# Patient Record
Sex: Female | Born: 1935 | Race: White | Hispanic: No | Smoking: Never smoker
Health system: Southern US, Community
[De-identification: ages and names within clinical notes are randomized; demographics above are authoritative.]

## PROBLEM LIST (undated history)

## (undated) DIAGNOSIS — I10 Essential (primary) hypertension: Secondary | ICD-10-CM

## (undated) DIAGNOSIS — F0390 Unspecified dementia without behavioral disturbance: Secondary | ICD-10-CM

## (undated) DIAGNOSIS — E785 Hyperlipidemia, unspecified: Secondary | ICD-10-CM

---

## 2015-07-01 ENCOUNTER — Other Ambulatory Visit
Admission: RE | Admit: 2015-07-01 | Discharge: 2015-07-01 | Disposition: A | Discharge status: Home / Self Care | Payer: Medicare Other | Source: Ambulatory Visit | Attending: Nurse Practitioner | Admitting: Nurse Practitioner

## 2015-07-01 DIAGNOSIS — D649 Anemia, unspecified: Secondary | ICD-10-CM | POA: Insufficient documentation

## 2015-07-01 DIAGNOSIS — I10 Essential (primary) hypertension: Secondary | ICD-10-CM | POA: Insufficient documentation

## 2015-07-01 LAB — CBC
HCT: 36.9 % (ref 35.0–47.0)
HEMOGLOBIN: 12.3 g/dL (ref 12.0–16.0)
MCH: 31.2 pg (ref 26.0–34.0)
MCHC: 33.4 g/dL (ref 32.0–36.0)
MCV: 93.4 fL (ref 80.0–100.0)
Platelets: 234 10*3/uL (ref 150–440)
RBC: 3.95 MIL/uL (ref 3.80–5.20)
RDW: 13.9 % (ref 11.5–14.5)
WBC: 9.5 10*3/uL (ref 3.6–11.0)

## 2015-07-01 LAB — COMPREHENSIVE METABOLIC PANEL
ALK PHOS: 57 U/L (ref 38–126)
ALT: 15 U/L (ref 14–54)
ANION GAP: 3 — AB (ref 5–15)
AST: 19 U/L (ref 15–41)
Albumin: 3.4 g/dL — ABNORMAL LOW (ref 3.5–5.0)
BUN: 15 mg/dL (ref 6–20)
CALCIUM: 9.3 mg/dL (ref 8.9–10.3)
CO2: 29 mmol/L (ref 22–32)
Chloride: 99 mmol/L — ABNORMAL LOW (ref 101–111)
Creatinine, Ser: 0.55 mg/dL (ref 0.44–1.00)
GFR calc non Af Amer: >60 mL/min (ref >60)
Glucose, Bld: 71 mg/dL (ref 65–99)
POTASSIUM: 5 mmol/L (ref 3.5–5.1)
SODIUM: 131 mmol/L — AB (ref 135–145)
TOTAL PROTEIN: 6.5 g/dL (ref 6.5–8.1)
Total Bilirubin: 0.6 mg/dL (ref 0.3–1.2)

## 2015-07-01 LAB — OCCULT BLOOD X 1 CARD TO LAB, STOOL: Fecal Occult Bld: NEGATIVE

## 2015-07-06 ENCOUNTER — Other Ambulatory Visit
Admission: RE | Admit: 2015-07-06 | Discharge: 2015-07-06 | Disposition: A | Discharge status: Home / Self Care | Payer: Medicare Other | Source: Ambulatory Visit | Attending: Nurse Practitioner | Admitting: Nurse Practitioner

## 2015-07-06 DIAGNOSIS — N390 Urinary tract infection, site not specified: Secondary | ICD-10-CM | POA: Insufficient documentation

## 2015-07-06 LAB — URINALYSIS COMPLETE WITH MICROSCOPIC (ARMC ONLY)
BILIRUBIN URINE: NEGATIVE
Glucose, UA: NEGATIVE mg/dL
Hgb urine dipstick: NEGATIVE
KETONES UR: NEGATIVE mg/dL
NITRITE: NEGATIVE
Protein, ur: NEGATIVE mg/dL
Specific Gravity, Urine: 1.015 (ref 1.005–1.030)
Squamous Epithelial / LPF: NONE SEEN
pH: 8 (ref 5.0–8.0)

## 2015-07-09 LAB — URINE CULTURE

## 2015-07-15 ENCOUNTER — Other Ambulatory Visit
Admission: RE | Admit: 2015-07-15 | Discharge: 2015-07-15 | Disposition: A | Discharge status: Home / Self Care | Payer: Medicare Other | Source: Ambulatory Visit | Attending: Nurse Practitioner | Admitting: Nurse Practitioner

## 2015-07-15 DIAGNOSIS — N390 Urinary tract infection, site not specified: Secondary | ICD-10-CM | POA: Diagnosis present

## 2015-07-15 LAB — URINALYSIS COMPLETE WITH MICROSCOPIC (ARMC ONLY)
BILIRUBIN URINE: NEGATIVE
Glucose, UA: NEGATIVE mg/dL
Ketones, ur: NEGATIVE mg/dL
NITRITE: NEGATIVE
Protein, ur: NEGATIVE mg/dL
Specific Gravity, Urine: 1.015 (ref 1.005–1.030)
pH: 7 (ref 5.0–8.0)

## 2015-07-17 LAB — URINE CULTURE

## 2016-04-08 ENCOUNTER — Emergency Department: Payer: Medicare Other

## 2016-04-08 ENCOUNTER — Encounter: Payer: Self-pay | Admitting: Emergency Medicine

## 2016-04-08 ENCOUNTER — Emergency Department
Admission: EM | Admit: 2016-04-08 | Discharge: 2016-04-08 | Disposition: A | Discharge status: Home / Self Care | Payer: Medicare Other | Attending: Emergency Medicine | Admitting: Emergency Medicine

## 2016-04-08 DIAGNOSIS — S0181XA Laceration without foreign body of other part of head, initial encounter: Secondary | ICD-10-CM | POA: Diagnosis not present

## 2016-04-08 DIAGNOSIS — S0990XA Unspecified injury of head, initial encounter: Secondary | ICD-10-CM | POA: Diagnosis present

## 2016-04-08 DIAGNOSIS — Y92129 Unspecified place in nursing home as the place of occurrence of the external cause: Secondary | ICD-10-CM | POA: Diagnosis not present

## 2016-04-08 DIAGNOSIS — F0390 Unspecified dementia without behavioral disturbance: Secondary | ICD-10-CM | POA: Diagnosis not present

## 2016-04-08 DIAGNOSIS — Z79899 Other long term (current) drug therapy: Secondary | ICD-10-CM | POA: Insufficient documentation

## 2016-04-08 DIAGNOSIS — Y939 Activity, unspecified: Secondary | ICD-10-CM | POA: Insufficient documentation

## 2016-04-08 DIAGNOSIS — W19XXXA Unspecified fall, initial encounter: Secondary | ICD-10-CM | POA: Diagnosis not present

## 2016-04-08 DIAGNOSIS — I10 Essential (primary) hypertension: Secondary | ICD-10-CM | POA: Insufficient documentation

## 2016-04-08 DIAGNOSIS — Y999 Unspecified external cause status: Secondary | ICD-10-CM | POA: Insufficient documentation

## 2016-04-08 HISTORY — DX: Hyperlipidemia, unspecified: E78.5

## 2016-04-08 HISTORY — DX: Essential (primary) hypertension: I10

## 2016-04-08 HISTORY — DX: Unspecified dementia, unspecified severity, without behavioral disturbance, psychotic disturbance, mood disturbance, and anxiety: F03.90

## 2016-04-08 NOTE — ED Provider Notes (Signed)
[facility] Emergency Department Provider Note   ____________________________________________   First MD Initiated Contact with Patient 04/08/16 0216     (approximate)  I have reviewed the triage vital signs and the nursing notes.   HISTORY  Chief Complaint Fall ([facility]. [facility]  Patient with dementia and unable to participate in history  HPI Shelby Porter is a 82 y.o. female who comes from her nursing home this evening with a concern for a fall. The patient was found in bed with a laceration to her forehead. The patient was sleeping when she was found they were just doing checks on the patient's. The patient has no complaints at this time and does not want to be touched. The patient is unable to recall what occurred and she has dementia. She was sent in for evaluation for a fall   Past Medical History:  Diagnosis Date  . Dementia   . Hyperlipidemia   . Hypertension     There are no active problems to display for this patient.   History reviewed. No pertinent surgical history.  Prior to Admission medications   Medication Sig Start Date End Date Taking? Authorizing Provider  atorvastatin (LIPITOR) 10 MG tablet Take 10 mg by mouth daily.   Yes Historical Provider, MD    Allergies Patient has no known allergies.  No family history on file.  Social History Social History  Substance Use Topics  . Smoking status: Never Smoker  . Smokeless tobacco: Not on file  . Alcohol use No    Review of Systems  Unable to assess due to patient dementia ____________________________________________   PHYSICAL EXAM:  VITAL SIGNS: ED Triage Vitals  Enc Vitals Group     BP 04/08/16 0225 (!) 125/58     Pulse Rate 04/08/16 0225 63     Resp 04/08/16 0225 16     Temp 04/08/16 0225 97.5 F (36.4 C)     Temp Source 04/08/16 0225 Axillary     SpO2 04/08/16 0225 100 %     Weight 04/08/16 0226 150 lb (68 kg)   Height 04/08/16 0226 5\' 5"  (1.651 m)     Head Circumference --      Peak Flow --      Pain Score --      Pain Loc --      Pain Edu? --      Excl. in GC? --     Constitutional: Alert and Disoriented, yelling. no acute distress. Eyes: Conjunctivae are normal. PERRL. EOMI. Head: 2 cm vertical laceration to forehead with a skin tear over her left eyebrow Nose: No congestion/rhinnorhea. Mouth/Throat: Mucous membranes are moist.  Oropharynx non-erythematous. Neck: No cervical spine tenderness to palpation. Cardiovascular: Normal rate, regular rhythm. Grossly normal heart sounds.  Good peripheral circulation. Respiratory: Normal respiratory effort.  No retractions. Lungs CTAB. Gastrointestinal: Soft and nontender. No distention. Positive bowel sounds Musculoskeletal: No lower extremity tenderness nor edema.   Neurologic:  Normal speech and language.  Skin:  Skin is warm, dry and intact. Marland Kitchen. Psychiatric: Mood and affect are normal.   ____________________________________________   LABS (all labs ordered are listed, but only abnormal results are displayed)  Labs Reviewed - No data to display ____________________________________________  EKG  None  ____________________________________________  RADIOLOGY  CT head and cervical spine ____________________________________________   PROCEDURES  Procedure(s) performed: please, see procedure note(s).  Marland Kitchen..Laceration Repair Date/Time: 04/08/2016 2:30 AM Performed by: Rebecka ApleyWEBSTER, ALLISON P Authorized by:  WEBSTER, ALLISON P   Anesthesia (see MAR for exact dosages):    Anesthesia method:  None Laceration details:    Location:  Face   Face location:  Forehead   Length (cm):  2 Repair type:    Repair type:  Simple Exploration:    Hemostasis achieved with:  Direct pressure   Contaminated: no   Treatment:    Area cleansed with:  Saline   Amount of cleaning:  Standard   Irrigation solution:  Sterile saline Skin repair:    Repair  method:  Tissue adhesive Approximation:    Approximation:  Close   Vermilion border: well-aligned   Post-procedure details:    Dressing:  Open (no dressing)   Patient tolerance of procedure:  Tolerated well, no immediate complications    Critical Care performed: No  ____________________________________________   INITIAL IMPRESSION / ASSESSMENT AND PLAN / ED COURSE  Pertinent labs & imaging results that were available during my care of the patient were reviewed by me and considered in my medical decision making (see chart for details).  This is a 82 year old female who comes into the hospital today with a laceration to her forehead. The patient was not found on the floor but was already back in bed. We are unsure if the patient rolled over and hit her head on her night and if she fell out of bed. We will send the patient for a CT of her head and cervical spine. I did repair the patient's laceration. She'll be reevaluated.  Clinical Course as of Apr 08 340  Sun Apr 08, 2016  0322 1. Small right frontal scalp hematoma without fracture or acute intracranial abnormality. Atrophy and chronic small vessel ischemia. 2. Degenerative change throughout cervical spine without acute fracture or subluxation. 3. Incidental 5 mm right upper lobe pulmonary nodule. Prior exams to evaluate for imaging stability. Correlation with any prior exams recommended, as well as patient preference and comorbidities. In the absence of prior comparison, no follow-up needed if patient is low-risk. Non-contrast chest CT can be considered in 12 months if patient is high-risk.    CT Cervical Spine Wo Contrast [AW]    Clinical Course User Index [AW] Rebecka ApleyAllison P Webster, MD   The patient's CT scan does not show any skull fractures or intracranial hemorrhage. I did repair the patient's laceration. We also confirmed that the patient's baseline is to only know herself and to yell when she is touched. The patient will  be discharged home.  ____________________________________________   FINAL CLINICAL IMPRESSION(S) / ED DIAGNOSES  Final diagnoses:  Fall, initial encounter  Injury of head, initial encounter  Laceration of forehead, initial encounter      NEW MEDICATIONS STARTED DURING THIS VISIT:  New Prescriptions   No medications on file     Note:  This document was prepared using Dragon voice recognition software and may include unintentional dictation errors.    Rebecka ApleyAllison P Webster, MD 04/08/16 [phone]

## 2016-04-08 NOTE — ED Notes (Signed)
Pt sleeping, bed in low position, siderails up x2. resps unlabored.

## 2016-04-08 NOTE — ED Notes (Signed)
Patient transported to CT 

## 2016-04-08 NOTE — ED Triage Notes (Signed)
[facility]. [facility].  Pt. Found in bed sleeping when EMS arrived.  Pt. Has 1 laceration above rt. Eye and skin tear above lt. Eye.

## 2016-04-08 NOTE — ED Notes (Signed)
Pt with hematoma noted above right eyebrow, abrasion noted above left eyebrow approx 1cm. Pt with linear laceration approx 0.5cm noted to medial hematoma. No other injury noted. Pt arrived without medical history forms from [facility]. Pt is only able to confirm name and birthday, confused to other questions. Pt states "leave me alone, ow, get" when touched. Skin slightly pale, warm and dry.

## 2016-04-08 NOTE — Discharge Instructions (Signed)
Please follow up with your primary care physician.

## 2016-04-08 NOTE — ED Notes (Signed)
Report called to [employer]ie poteat, [facility]

## 2016-11-28 DEATH — deceased

## 2018-06-12 IMAGING — CT CT HEAD W/O CM
4 of 7 series · 14 of 47 positions shown, 15 images · non-contrast
Comparison: None.

CLINICAL DATA: Dementia patient post unwitnessed fall this evening
with head laceration.

EXAM:
CT HEAD WITHOUT CONTRAST
CT CERVICAL SPINE WITHOUT CONTRAST
TECHNIQUE: Multidetector CT imaging of the head and cervical spine was
performed following the standard protocol without intravenous
contrast. Multiplanar CT image reconstructions of the cervical spine
were also generated.

[Series 2: head wo · axial · 0.59mm/px · z∈[-154,-74]mm · 3 of 33 slices shown, 4 images]
[im 9/33  brain]
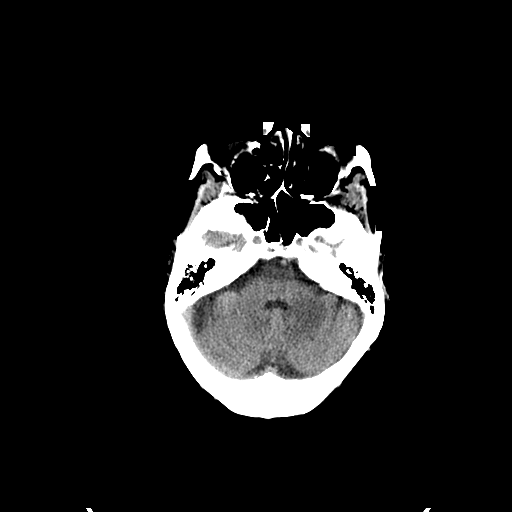
[im 9/33  bone]
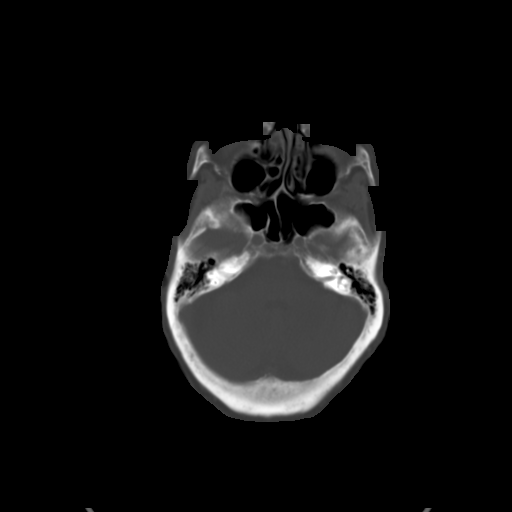
[im 17/33  brain]
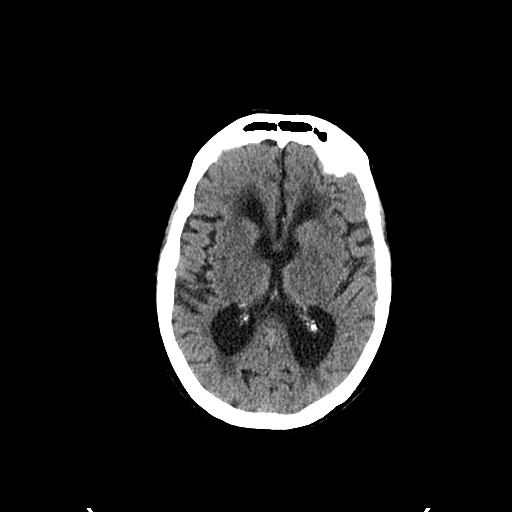
[im 25/33  brain]
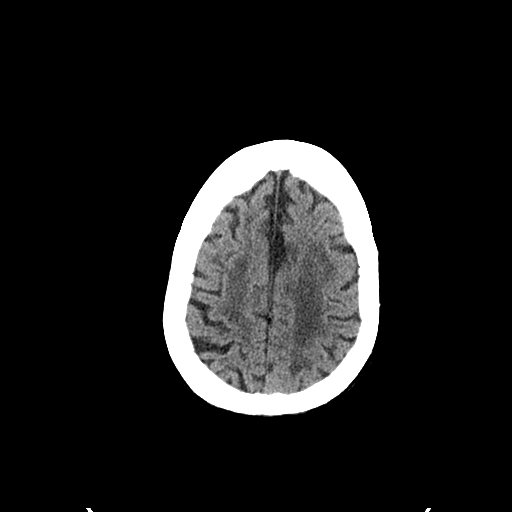

[Series 4: coronal soft tissue · coronal · 0.28mm/px · 3 of 62 slices shown]
[im 5/62  brain]
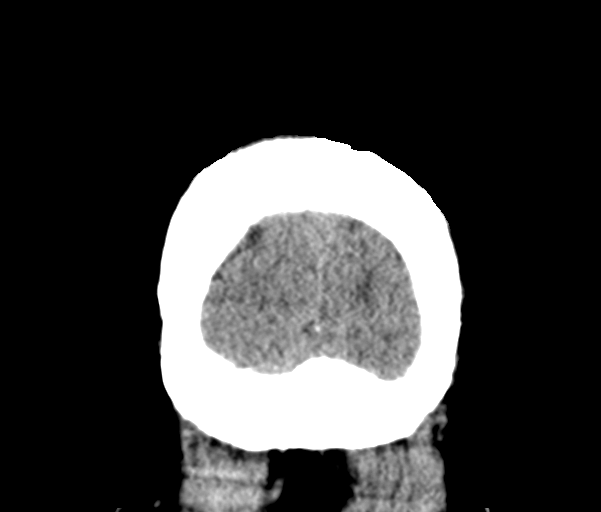
[im 9/62  brain]
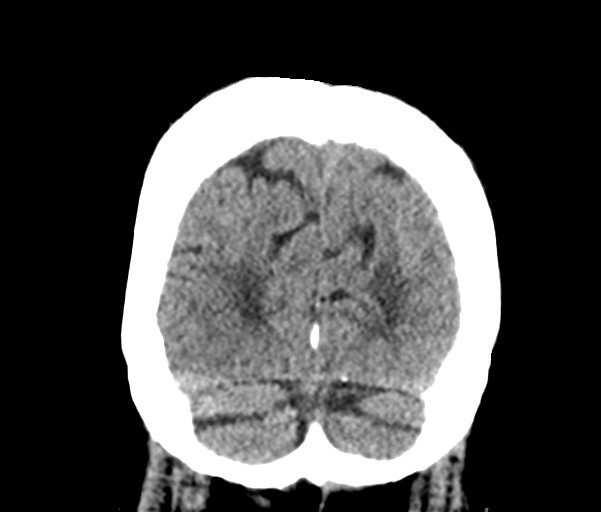
[im 14/62  brain]
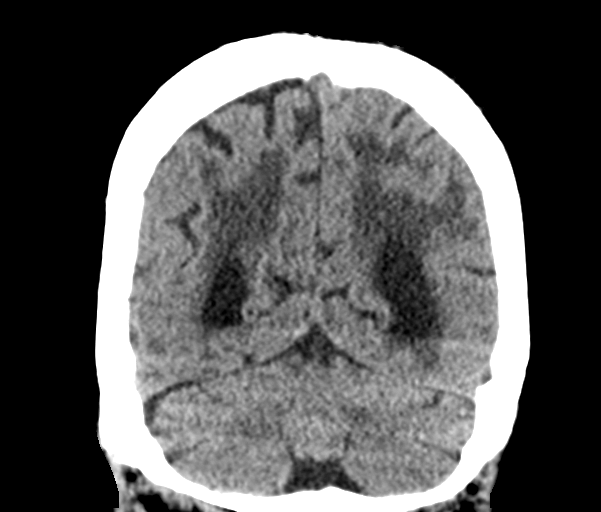

[Series 5: sagittal soft tissue · sagittal · 0.33mm/px · 2 of 46 slices shown]
[im 16/46  brain]
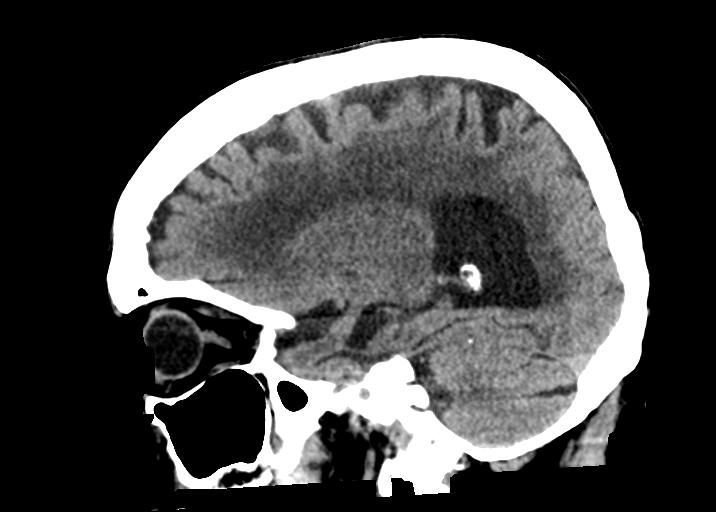
[im 31/46  brain]
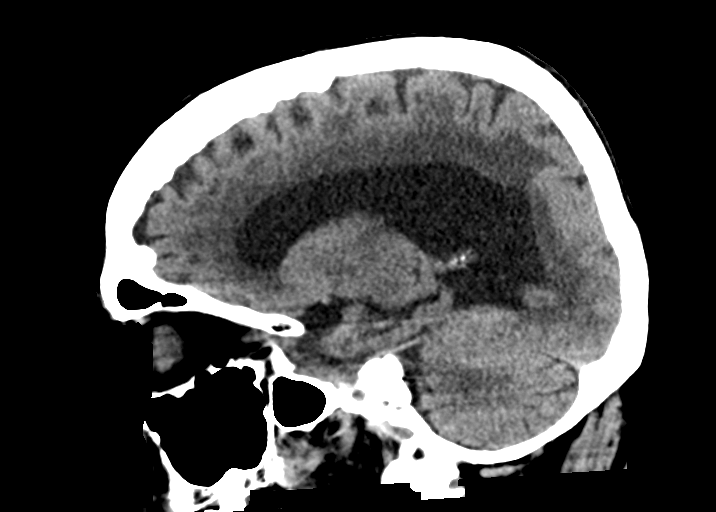

[Series 12: orthogonal bone · axial · 0.28mm/px · z∈[-336,-243]mm · 6 of 86 slices shown]
[im 7/86  bone]
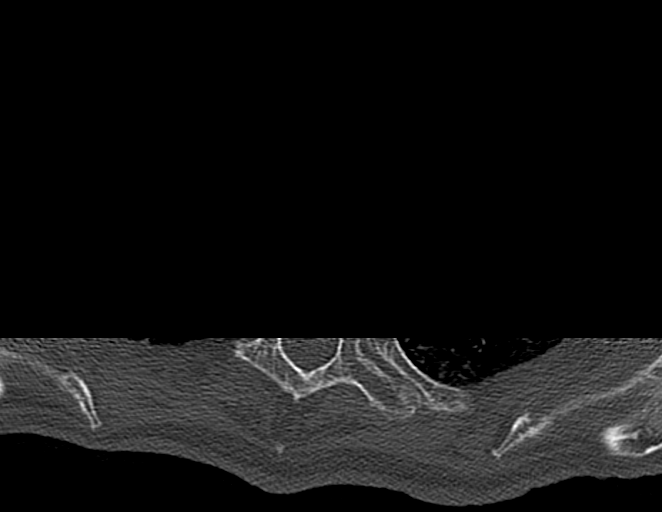
[im 20/86  bone]
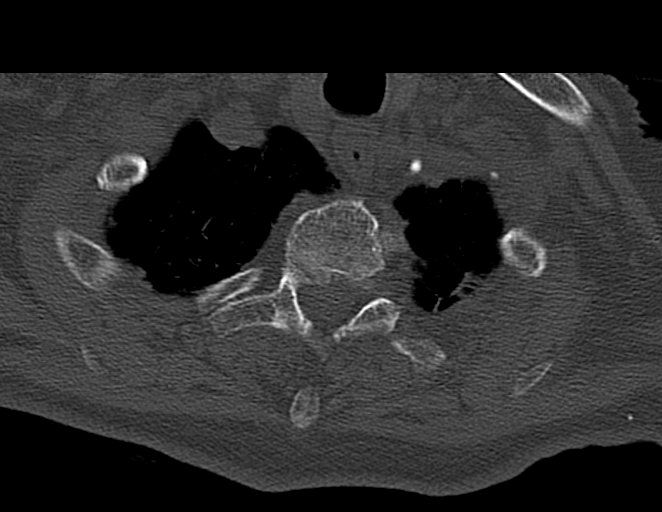
[im 27/86  bone]
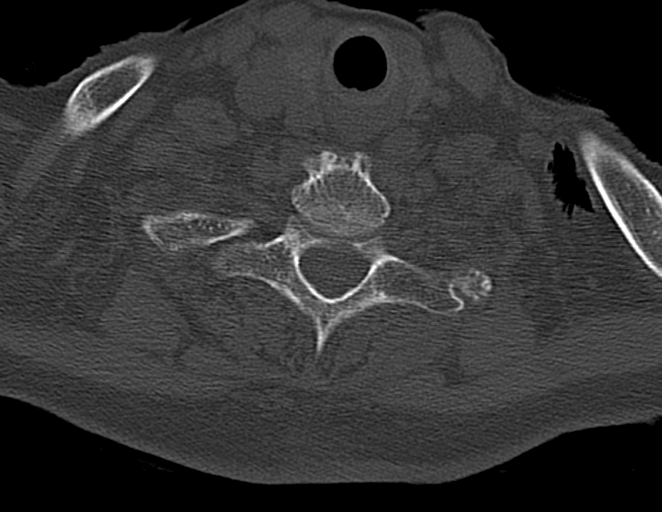
[im 40/86  bone]
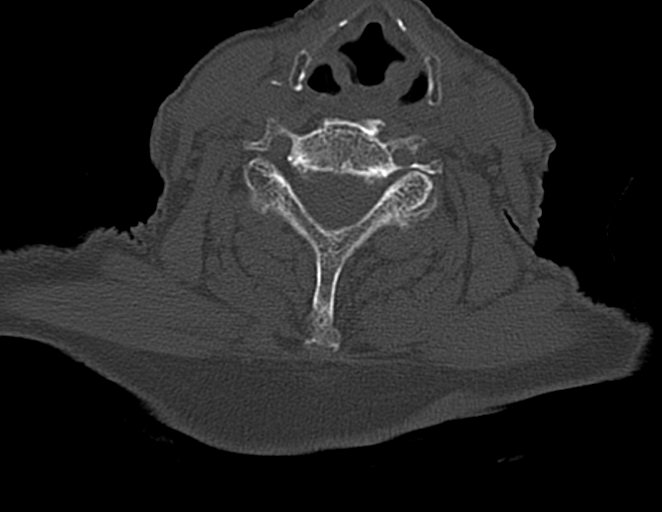
[im 46/86  bone]
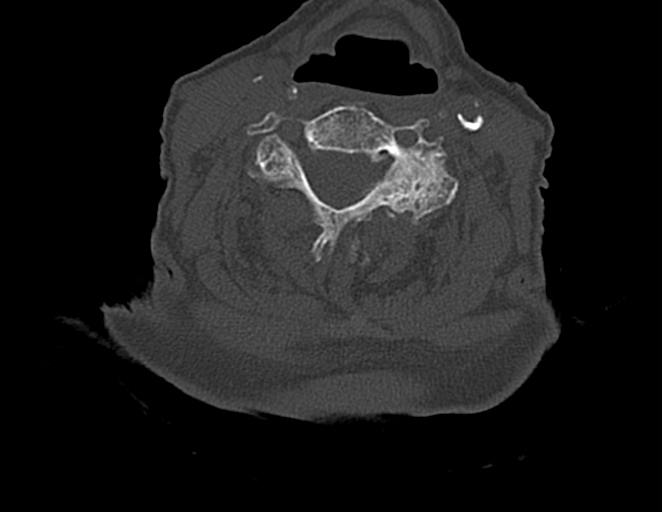
[im 59/86  bone]
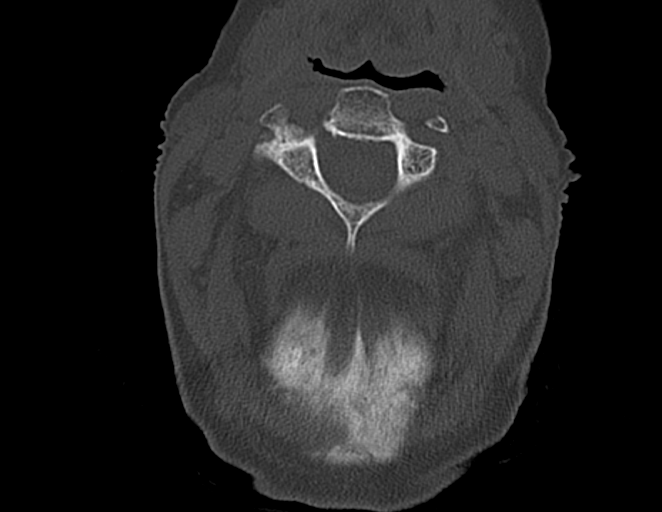

[14 of 47 positions shown; findings below may reference images not displayed]

FINDINGS: CT HEAD FINDINGS

Brain: Generalized atrophy. Moderate chronic small vessel ischemia.
No intracranial hemorrhage or CT findings of acute ischemia. No
subdural or extra-axial fluid collection. No mass effect or midline
shift. Remote lacunar infarct in the left cerebellum.

Vascular: Atherosclerosis of skullbase vasculature without
hyperdense vessel or abnormal calcification.

Skull: Right frontal scalp hematoma without calvarial fracture.

Sinuses/Orbits: Post left cataract resection. Paranasal sinuses and
mastoid air cells are well-aerated.

Other: None.

CT CERVICAL SPINE FINDINGS

Alignment: Exaggerated cervical lordosis. No listhesis, jumped or
perched facets.

Skull base and vertebrae: No acute fracture. No primary bone lesion
or focal pathologic process. Irregularity of superior endplate of C4
appears chronic and well corticated.

Soft tissues and spinal canal: No prevertebral fluid or swelling. No
visible canal hematoma.

Disc levels: Prominent disc space narrowing C5-C6 and C6-C7 with
endplate spurring. Multilevel facet arthropathy. Multilevel neural
foraminal stenosis.

Upper chest: 5 mm pulmonary nodule right upper lobe. No acute or
traumatic abnormality.

Other: Right thyroid nodule does not meet size criteria for biopsy.
Carotid vascular calcifications.
IMPRESSION: 1. Small right frontal scalp hematoma without fracture or acute
intracranial abnormality. Atrophy and chronic small vessel ischemia.
2. Degenerative change throughout cervical spine without acute
fracture or subluxation.
3. Incidental 5 mm right upper lobe pulmonary nodule. Prior exams to
evaluate for imaging stability. Correlation with any prior exams
recommended, as well as patient preference and comorbidities. In the
absence of prior comparison, no follow-up needed if patient is
low-risk. Non-contrast chest CT can be considered in 12 months if
patient is high-risk. This recommendation follows the consensus
statement: Guidelines for Management of Incidental Pulmonary Nodules
Detected on CT Images: From the [HOSPITAL] 1797; Radiology
